# Patient Record
Sex: Female | Born: 2000 | Race: White | Hispanic: No | Marital: Single | Smoking: Never smoker
Health system: Southern US, Community
[De-identification: ages and names within clinical notes are randomized; demographics above are authoritative.]

## PROBLEM LIST (undated history)

## (undated) DIAGNOSIS — J45909 Unspecified asthma, uncomplicated: Secondary | ICD-10-CM

---
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
## 2020-01-08 ENCOUNTER — Other Ambulatory Visit: Payer: Self-pay

## 2020-01-08 ENCOUNTER — Encounter: Payer: Self-pay | Admitting: Emergency Medicine

## 2020-01-08 ENCOUNTER — Emergency Department (INDEPENDENT_AMBULATORY_CARE_PROVIDER_SITE_OTHER)
Admission: EM | Admit: 2020-01-08 | Discharge: 2020-01-08 | Disposition: A | Discharge status: Home / Self Care | Payer: BC Managed Care – PPO | Source: Home / Self Care | Attending: Family Medicine | Admitting: Family Medicine

## 2020-01-08 DIAGNOSIS — L600 Ingrowing nail: Secondary | ICD-10-CM | POA: Diagnosis not present

## 2020-01-08 MED ORDER — DOXYCYCLINE HYCLATE 100 MG PO CAPS: 100.0000 mg | ORAL_CAPSULE | Freq: Two times a day (BID) | ORAL | 0 refills | Status: DC

## 2020-01-08 NOTE — ED Triage Notes (Signed)
Rt great ingrown toe nail

## 2020-01-08 NOTE — Discharge Instructions (Addendum)
Change bandage twice daily and apply Bacitracin ointment to toe.  Keep toe clean and dry.  Return for any signs of infection (or follow-up with family doctor):  Increasing redness, swelling, pain, heat, drainage, etc.   Soak your foot in warm, soapy water. Do this for 20 minutes, 3 times a day. This helps to keep your toe clean and keep your skin soft.  Trim your toenails regularly and carefully. Cut your toenails straight across to prevent injury to the skin at the corners of the toenail. Do not cut your nails in a curved shape.

## 2020-01-08 NOTE — ED Provider Notes (Signed)
[name]    CSN: [id number] Arrival date & time: 01/08/20  1652      History   Chief Complaint Chief Complaint  Patient presents with  . Ingrown Toenail    HPI Ashley Vasquez is a 19 y.o. female.   Patient developed pain, swelling, and drainage from her left great toenail medial edge yesterday. She recently had a pedicure.  The history is provided by the patient.  Toe Pain This is a new problem. The current episode started yesterday. The problem occurs constantly. The problem has been gradually worsening. The symptoms are aggravated by walking and standing. Nothing relieves the symptoms. She has tried nothing for the symptoms.    Past Medical History:  Diagnosis Date  . Asthma     There are no problems to display for this patient.   History reviewed. No pertinent surgical history.  OB History   No obstetric history on file.      Home Medications    Prior to Admission medications   Medication Sig Start Date End Date Taking? Authorizing Provider  budesonide-formoterol (SYMBICORT) 160-4.5 MCG/ACT inhaler Inhale 1 puff into the lungs at bedtime.    [provider]  cetirizine-pseudoephedrine (ZYRTEC-D) 5-120 MG tablet Take 1 tablet by mouth daily. 05/21/17   Dorena Bodo, NP  doxycycline (VIBRAMYCIN) 100 MG capsule Take 1 capsule (100 mg total) by mouth 2 (two) times daily. Take with food. 01/08/20   Lattie Haw, MD  hydrOXYzine (ATARAX/VISTARIL) 25 MG tablet Take 25 mg by mouth at bedtime.    [provider]  montelukast (SINGULAIR) 10 MG tablet Take 10 mg by mouth at bedtime.    [provider]    Family History Family History  Problem Relation Age of Onset  . Healthy Mother   . Hypertension Father     Social History Social History   Tobacco Use  . Smoking status: Never Smoker  . Smokeless tobacco: Never Used  Substance Use Topics  . Alcohol use: Yes  . Drug use: Not on file     Allergies   Patient  has no known allergies.   Review of Systems Review of Systems  All other systems reviewed and are negative.    Physical Exam Triage Vital Signs ED Triage Vitals  Enc Vitals Group     BP 01/08/20 1715 132/85     Pulse Rate 01/08/20 1715 (!) 58     Resp --      Temp 01/08/20 1715 98.2 F (36.8 C)     Temp Source 01/08/20 1715 Oral     SpO2 01/08/20 1715 100 %     Weight 01/08/20 1716 130 lb (59 kg)     Height 01/08/20 1716 5\' 5"  (1.651 m)     Head Circumference --      Peak Flow --      Pain Score 01/08/20 1715 9     Pain Loc --      Pain Edu? --      Excl. in GC? --    No data found.  Updated Vital Signs BP 132/85 (BP Location: Right Arm)   Pulse (!) 58   Temp 98.2 F (36.8 C) (Oral)   Ht 5\' 5"  (1.651 m)   Wt 59 kg   LMP 01/06/2020 (Exact Date)   SpO2 100%   BMI 21.63 kg/m   Visual Acuity Right Eye Distance:   Left Eye Distance:   Bilateral Distance:    Right Eye Near:  Left Eye Near:    Bilateral Near:     Physical Exam Vitals reviewed.  Constitutional:      General: She is not in acute distress. Eyes:     Pupils: Pupils are equal, round, and reactive to light.  Cardiovascular:     Rate and Rhythm: Normal rate.  Pulmonary:     Effort: Pulmonary effort is normal.  Musculoskeletal:       Feet:     Comments: Medial edge of right great toenail is mildly swollen, erythematous and tender to palpation.  There is a small amount of purulent drainage present.  Skin:    General: Skin is warm and dry.  Neurological:     Mental Status: She is alert.      UC Treatments / Results  Labs (all labs ordered are listed, but only abnormal results are displayed) Labs Reviewed - No data to display  EKG   Radiology No results found.  Procedures Procedures (including critical care time)  Medications Ordered in UC Medications - No data to display  Initial Impression / Assessment and Plan / UC Course  I have reviewed the triage vital signs and the  nursing notes.  Pertinent labs & imaging results that were available during my care of the patient were reviewed by me and considered in my medical decision making (see chart for details).    Minimal ingrown nail, if at all.  Nail edge resection not indicated.  Mild cellulitis present. Begin doxycycline.  Culture taken.  Discussed proper nail care to minimize ingrown nails    Final Clinical Impressions(s) / UC Diagnoses   Final diagnoses:  Ingrown toenail of left foot     Discharge Instructions     Change bandage twice daily and apply Bacitracin ointment to toe.  Keep toe clean and dry.  Return for any signs of infection (or follow-up with family doctor):  Increasing redness, swelling, pain, heat, drainage, etc.   Soak your foot in warm, soapy water. Do this for 20 minutes, 3 times a day. This helps to keep your toe clean and keep your skin soft.  Trim your toenails regularly and carefully. Cut your toenails straight across to prevent injury to the skin at the corners of the toenail. Do not cut your nails in a curved shape.    ED Prescriptions    Medication Sig Dispense Auth. Provider   doxycycline (VIBRAMYCIN) 100 MG capsule Take 1 capsule (100 mg total) by mouth 2 (two) times daily. Take with food. 14 capsule Kandra Nicolas, MD        Kandra Nicolas, MD 01/08/20 [phone]

## 2020-01-13 LAB — WOUND CULTURE
MICRO NUMBER:: 10159953
SPECIMEN QUALITY:: ADEQUATE

## 2020-10-06 ENCOUNTER — Emergency Department (INDEPENDENT_AMBULATORY_CARE_PROVIDER_SITE_OTHER): Payer: BC Managed Care – PPO

## 2020-10-06 ENCOUNTER — Emergency Department
Admission: EM | Admit: 2020-10-06 | Discharge: 2020-10-06 | Disposition: A | Discharge status: Home / Self Care | Payer: BC Managed Care – PPO | Source: Home / Self Care | Attending: Family Medicine | Admitting: Family Medicine

## 2020-10-06 ENCOUNTER — Other Ambulatory Visit: Payer: Self-pay

## 2020-10-06 DIAGNOSIS — R0789 Other chest pain: Secondary | ICD-10-CM | POA: Diagnosis not present

## 2020-10-06 DIAGNOSIS — R0602 Shortness of breath: Secondary | ICD-10-CM | POA: Diagnosis not present

## 2020-10-06 DIAGNOSIS — M7918 Myalgia, other site: Secondary | ICD-10-CM

## 2020-10-06 NOTE — ED Provider Notes (Signed)
[name]    CSN: [id number] Arrival date & time: 10/06/20  1526      History   Chief Complaint Chief Complaint  Patient presents with  . Back Pain    HPI Ashley Vasquez is a 19 y.o. female.   Patient awakened two days ago with pain in her posterior upper chest, radiating around her chest.  The pain is worse with inspiration, although not pleuritic, and she denies shortness of breath.  She recalls no injury or change in activities.  She denies fevers, chills, and sweats and feels well otherwise.  She denies recent URI, and no pain or swelling in lower legs.  Ibuprofen 400mg  has not been very helpful.  The history is provided by the patient.    Past Medical History:  Diagnosis Date  . Asthma     There are no problems to display for this patient.   History reviewed. No pertinent surgical history.  OB History   No obstetric history on file.      Home Medications    Prior to Admission medications   Medication Sig Start Date End Date Taking? Authorizing Provider  drospirenone-ethinyl estradiol (YAZ) 3-0.02 MG tablet Take by mouth. 12/23/19  Yes [provider]  budesonide-formoterol (SYMBICORT) 160-4.5 MCG/ACT inhaler Inhale 1 puff into the lungs at bedtime.    [provider]  cetirizine-pseudoephedrine (ZYRTEC-D) 5-120 MG tablet Take 1 tablet by mouth daily. 05/21/17   07/22/17, NP  doxycycline (VIBRAMYCIN) 100 MG capsule Take 1 capsule (100 mg total) by mouth 2 (two) times daily. Take with food. 01/08/20   01/10/20, MD  hydrOXYzine (ATARAX/VISTARIL) 25 MG tablet Take 25 mg by mouth at bedtime.    [provider]  mometasone (ELOCON) 0.1 % cream Apply topically. 04/22/20   [provider]  montelukast (SINGULAIR) 10 MG tablet Take 10 mg by mouth at bedtime.    [provider]    Family History Family History  Problem Relation Age of Onset  . Healthy Mother   . Hypertension Father     Social  History Social History   Tobacco Use  . Smoking status: Never Smoker  . Smokeless tobacco: Never Used  Vaping Use  . Vaping Use: Never used  Substance Use Topics  . Alcohol use: Yes  . Drug use: Not on file     Allergies   Polymyxin b   Review of Systems Review of Systems  Constitutional: Negative for activity change, appetite change, chills, diaphoresis, fatigue and fever.  HENT: Negative.   Eyes: Negative.   Respiratory: Positive for chest tightness. Negative for cough, shortness of breath, wheezing and stridor.   Cardiovascular: Negative.   Genitourinary: Negative.   Musculoskeletal: Negative.   Skin: Negative for rash.  Neurological: Negative.   Hematological: Negative for adenopathy.     Physical Exam Triage Vital Signs ED Triage Vitals  Enc Vitals Group     BP 10/06/20 1542 114/77     Pulse Rate 10/06/20 1542 73     Resp 10/06/20 1542 17     Temp 10/06/20 1542 97.7 F (36.5 C)     Temp Source 10/06/20 1542 Oral     SpO2 10/06/20 1542 100 %     Weight --      Height --      Head Circumference --      Peak Flow --      Pain Score 10/06/20 1545 8     Pain Loc --  Pain Edu? --      Excl. in GC? --    No data found.  Updated Vital Signs BP 114/77 (BP Location: Left Arm)   Pulse 73   Temp 97.7 F (36.5 C) (Oral)   Resp 17   LMP 09/20/2020   SpO2 100%   Visual Acuity Right Eye Distance:   Left Eye Distance:   Bilateral Distance:    Right Eye Near:   Left Eye Near:    Bilateral Near:     Physical Exam Vitals and nursing note reviewed.  Constitutional:      General: She is not in acute distress. HENT:     Head: Normocephalic.     Right Ear: External ear normal.     Left Ear: External ear normal.     Nose: Nose normal.     Mouth/Throat:     Pharynx: Oropharynx is clear.  Eyes:     Conjunctiva/sclera: Conjunctivae normal.     Pupils: Pupils are equal, round, and reactive to light.  Cardiovascular:     Rate and Rhythm: Normal rate  and regular rhythm.     Heart sounds: Normal heart sounds.  Pulmonary:     Breath sounds: Normal breath sounds.       Comments: There is distinct tenderness over medial and inferior edges of both scapulae.  Pain elicited by resisted abduction of each shoulder while palpating rhomboid muscles. Tenderness is more pronounced on the left. Chest:     Chest wall: No tenderness.  Abdominal:     Palpations: Abdomen is soft.     Tenderness: There is no abdominal tenderness.  Musculoskeletal:        General: No tenderness.     Cervical back: Neck supple.     Right lower leg: No edema.     Left lower leg: No edema.  Lymphadenopathy:     Cervical: No cervical adenopathy.  Skin:    General: Skin is warm and dry.     Findings: No rash.  Neurological:     Mental Status: She is alert and oriented to person, place, and time.      UC Treatments / Results  Labs (all labs ordered are listed, but only abnormal results are displayed) Labs Reviewed  D-DIMER, QUANTITATIVE (NOT AT [facility])    EKG   Radiology DG Chest 2 View  Result Date: 10/06/2020 CLINICAL DATA:  19 year old female with back pain shortness of breath EXAM: CHEST - 2 VIEW COMPARISON:  None. FINDINGS: The heart size and mediastinal contours are within normal limits. Both lungs are clear. The visualized skeletal structures are unremarkable. IMPRESSION: No active cardiopulmonary disease. Electronically Signed   By: Gilmer Mor D.O.   On: 10/06/2020 16:25    Procedures Procedures (including critical care time)  Medications Ordered in UC Medications - No data to display  Initial Impression / Assessment and Plan / UC Course  I have reviewed the triage vital signs and the nursing notes.  Pertinent labs & imaging results that were available during my care of the patient were reviewed by me and considered in my medical decision making (see chart for details).    Normal chest x-ray reassuring.  Exam consistent with rhomboid  muscle inflammation.  PE unlikely but will check D-dimer as a precaution. Followup with Dr. [facility] if not improving about two weeks.   Final Clinical Impressions(s) / UC Diagnoses   Final diagnoses:  Posterior chest pain  Rhomboid muscle pain  Discharge Instructions     Apply ice pack to painful areas of back for 20 to 30 minutes, 3 to 4 times daily  Continue until pain decreases.  May take Ibuprofen 200mg , 4 tabs every 8 hours with food. Begin range of motion and stretching exercises as tolerated.  If symptoms become significantly worse during the night or over the weekend, proceed to the local emergency room.     ED Prescriptions    None        , MD 10/08/20 1103

## 2020-10-06 NOTE — Discharge Instructions (Addendum)
Apply ice pack to painful areas of back for 20 to 30 minutes, 3 to 4 times daily  Continue until pain decreases.  May take Ibuprofen 200mg , 4 tabs every 8 hours with food. Begin range of motion and stretching exercises as tolerated.  If symptoms become significantly worse during the night or over the weekend, proceed to the local emergency room.

## 2020-10-06 NOTE — ED Triage Notes (Signed)
Pt c/o mid back pain x 2 days. Says she just woke up like this. No known injury. Says pain wraps around rib cage, making it hard to breath. Hx of asthma. Ibuprofen with no relief.

## 2020-10-07 ENCOUNTER — Telehealth: Payer: Self-pay

## 2020-10-07 LAB — D-DIMER, QUANTITATIVE (NOT AT ARMC): D-Dimer, Quant: <0.19 mcg/mL FEU (ref <0.50)

## 2020-10-07 NOTE — Telephone Encounter (Signed)
D-dimer results discussed with patient (within normal limits). Questions asked and answers provided. Follow-up recommendations discussed and patient verbalized understanding.

## 2020-12-04 ENCOUNTER — Other Ambulatory Visit: Payer: Self-pay

## 2020-12-04 ENCOUNTER — Emergency Department (INDEPENDENT_AMBULATORY_CARE_PROVIDER_SITE_OTHER)
Admission: EM | Admit: 2020-12-04 | Discharge: 2020-12-04 | Disposition: A | Discharge status: Home / Self Care | Payer: BC Managed Care – PPO | Source: Home / Self Care | Attending: Emergency Medicine | Admitting: Emergency Medicine

## 2020-12-04 DIAGNOSIS — N3000 Acute cystitis without hematuria: Secondary | ICD-10-CM

## 2020-12-04 LAB — POCT URINALYSIS DIP (MANUAL ENTRY)
Bilirubin, UA: NEGATIVE
Blood, UA: NEGATIVE
Glucose, UA: NEGATIVE mg/dL
Ketones, POC UA: NEGATIVE mg/dL
Nitrite, UA: POSITIVE — AB
Protein Ur, POC: NEGATIVE mg/dL
Spec Grav, UA: 1.015 (ref 1.010–1.025)
Urobilinogen, UA: 0.2 E.U./dL
pH, UA: 8.5 — AB (ref 5.0–8.0)

## 2020-12-04 MED ORDER — PHENAZOPYRIDINE HCL 200 MG PO TABS: 200.0000 mg | ORAL_TABLET | Freq: Three times a day (TID) | ORAL | 0 refills | Status: AC | PRN

## 2020-12-04 MED ORDER — NITROFURANTOIN MONOHYD MACRO 100 MG PO CAPS: 100.0000 mg | ORAL_CAPSULE | Freq: Two times a day (BID) | ORAL | 0 refills | Status: AC

## 2020-12-04 NOTE — ED Triage Notes (Signed)
Patient presents to Urgent Care with complaints of burning with urination since three days ago. Patient reports she took extra strength azo and it has not gotten better, thinks she has a UTI.

## 2020-12-04 NOTE — ED Provider Notes (Signed)
HPI  SUBJECTIVE:  Ashley Vasquez is a 20 y.o. female who presents with 1 week of dysuria, urgency, frequency.  She reports low midline abdominal and back pain starting a few days ago.  No cloudy or odorous urine.  Not sure if there has been any hematuria.  No vaginal odor, rash, discharge, itching.  No nausea, vomiting, fevers, pelvic pain.  She is in a long-term monogamous relationship with a female who is asymptomatic.  STDs are not a concern today.  No antibiotics in the past month. No Antipyretic in the past 6 hours.  She has tried Azo without improvement in her symptoms.  No alleviating factors.  Symptoms are worse with urination.  She has a past medical history of frequent UTIs, nephrolithiasis, ovarian cysts.  She has a history of chlamydia, HSV, BV.  No history of pyelonephritis, gonorrhea, HIV, syphilis, trichomonas, yeast.  LMP: 2 days ago.  Denies the possibility being pregnant.  PMD: [facility] in [place]    Past Medical History:  Diagnosis Date  . Asthma     History reviewed. No pertinent surgical history.  Family History  Problem Relation Age of Onset  . Healthy Mother   . Hypertension Father     Social History   Tobacco Use  . Smoking status: Never Smoker  . Smokeless tobacco: Never Used  Vaping Use  . Vaping Use: Never used  Substance Use Topics  . Alcohol use: Yes    No current facility-administered medications for this encounter.  Current Outpatient Medications:  .  nitrofurantoin, macrocrystal-monohydrate, (MACROBID) 100 MG capsule, Take 1 capsule (100 mg total) by mouth 2 (two) times daily for 5 days., Disp: 10 capsule, Rfl: 0 .  phenazopyridine (PYRIDIUM) 200 MG tablet, Take 1 tablet (200 mg total) by mouth 3 (three) times daily as needed for pain., Disp: 6 tablet, Rfl: 0 .  budesonide-formoterol (SYMBICORT) 160-4.5 MCG/ACT inhaler, Inhale 1 puff into the lungs at bedtime., Disp: , Rfl:  .  cetirizine-pseudoephedrine (ZYRTEC-D) 5-120  MG tablet, Take 1 tablet by mouth daily., Disp: 30 tablet, Rfl: 1 .  drospirenone-ethinyl estradiol (YAZ) 3-0.02 MG tablet, Take by mouth., Disp: , Rfl:  .  hydrOXYzine (ATARAX/VISTARIL) 25 MG tablet, Take 25 mg by mouth at bedtime., Disp: , Rfl:  .  mometasone (ELOCON) 0.1 % cream, Apply topically., Disp: , Rfl:  .  montelukast (SINGULAIR) 10 MG tablet, Take 10 mg by mouth at bedtime., Disp: , Rfl:   Allergies  Allergen Reactions  . Polymyxin B Anaphylaxis     ROS  As noted in HPI.   Physical Exam  BP 113/73 (BP Location: Left Arm)   Pulse 78   Temp 98.6 F (37 C) (Oral)   Resp 16   SpO2 99%   Constitutional: Well developed, well nourished, no acute distress Eyes:  EOMI, conjunctiva normal bilaterally HENT: Normocephalic, atraumatic,mucus membranes moist Respiratory: Normal inspiratory effort Cardiovascular: Normal rate GI: nondistended. positive suprapubic, flank tenderness.  No lower quadrant tenderness. Back: No CVAT skin: No rash, skin intact Musculoskeletal: no deformities Neurologic: Alert & oriented x 3, no focal neuro deficits Psychiatric: Speech and behavior appropriate   ED Course   Medications - No data to display  Orders Placed This Encounter  Procedures  . Urine culture    Standing Status:   Standing    Number of Occurrences:   1    Order Specific Question:   List patient's active antibiotics    Answer:   macribuid  .  POCT urinalysis dipstick    Standing Status:   Standing    Number of Occurrences:   1    Results for orders placed or performed during the hospital encounter of 12/04/20 (from the past 24 hour(s))  POCT urinalysis dipstick     Status: Abnormal   Collection Time: 12/04/20  6:16 PM  Result Value Ref Range   Color, UA yellow yellow   Clarity, UA turbid (A) clear   Glucose, UA negative negative mg/dL   Bilirubin, UA negative negative   Ketones, POC UA negative negative mg/dL   Spec Grav, UA 9.509 3.267 - 1.025   Blood, UA  negative negative   pH, UA 8.5 (A) 5.0 - 8.0   Protein Ur, POC negative negative mg/dL   Urobilinogen, UA 0.2 0.2 or 1.0 E.U./dL   Nitrite, UA Positive (A) Negative   Leukocytes, UA Trace (A) Negative   No results found.  ED Clinical Impression  1. Acute cystitis without hematuria      ED Assessment/Plan  UA consistent with UTI with trace leukocytes, positive nitrite.  We talked about doing STD testing, but she is not concerned for this today and I feel that it is reasonable to try treating this as if it is a UTI initially.  She can return to clinic if not getting any better, and we can reconsider other causes of her symptoms such as GYN infections at that time.  We will send urine for culture to confirm diagnosis and antibiotic choice.  Home with Macrobid, Pyridium.  Return precautions given  Discussed labs,MDM, treatment plan, and plan for follow-up with patient. Discussed sn/sx that should prompt return to the ED. patient agrees with plan.   Meds ordered this encounter  Medications  . phenazopyridine (PYRIDIUM) 200 MG tablet    Sig: Take 1 tablet (200 mg total) by mouth 3 (three) times daily as needed for pain.    Dispense:  6 tablet    Refill:  0  . nitrofurantoin, macrocrystal-monohydrate, (MACROBID) 100 MG capsule    Sig: Take 1 capsule (100 mg total) by mouth 2 (two) times daily for 5 days.    Dispense:  10 capsule    Refill:  0    *This clinic note was created using Scientist, clinical (histocompatibility and immunogenetics). Therefore, there may be occasional mistakes despite careful proofreading.   ?    Domenick Gong, MD 12/05/20 1301

## 2020-12-04 NOTE — Discharge Instructions (Addendum)
Push plenty of fluids.  Finish the [employer], even if you feel better.  Pyridium will help with your symptoms.  It will turn your urine orange.

## 2022-05-27 IMAGING — DX DG CHEST 2V
2 series · 2 of 2 positions shown · non-contrast
Comparison: None.

CLINICAL DATA: 19-year-old female with back pain shortness of
breath

EXAM:
CHEST - 2 VIEW

[chest pa]
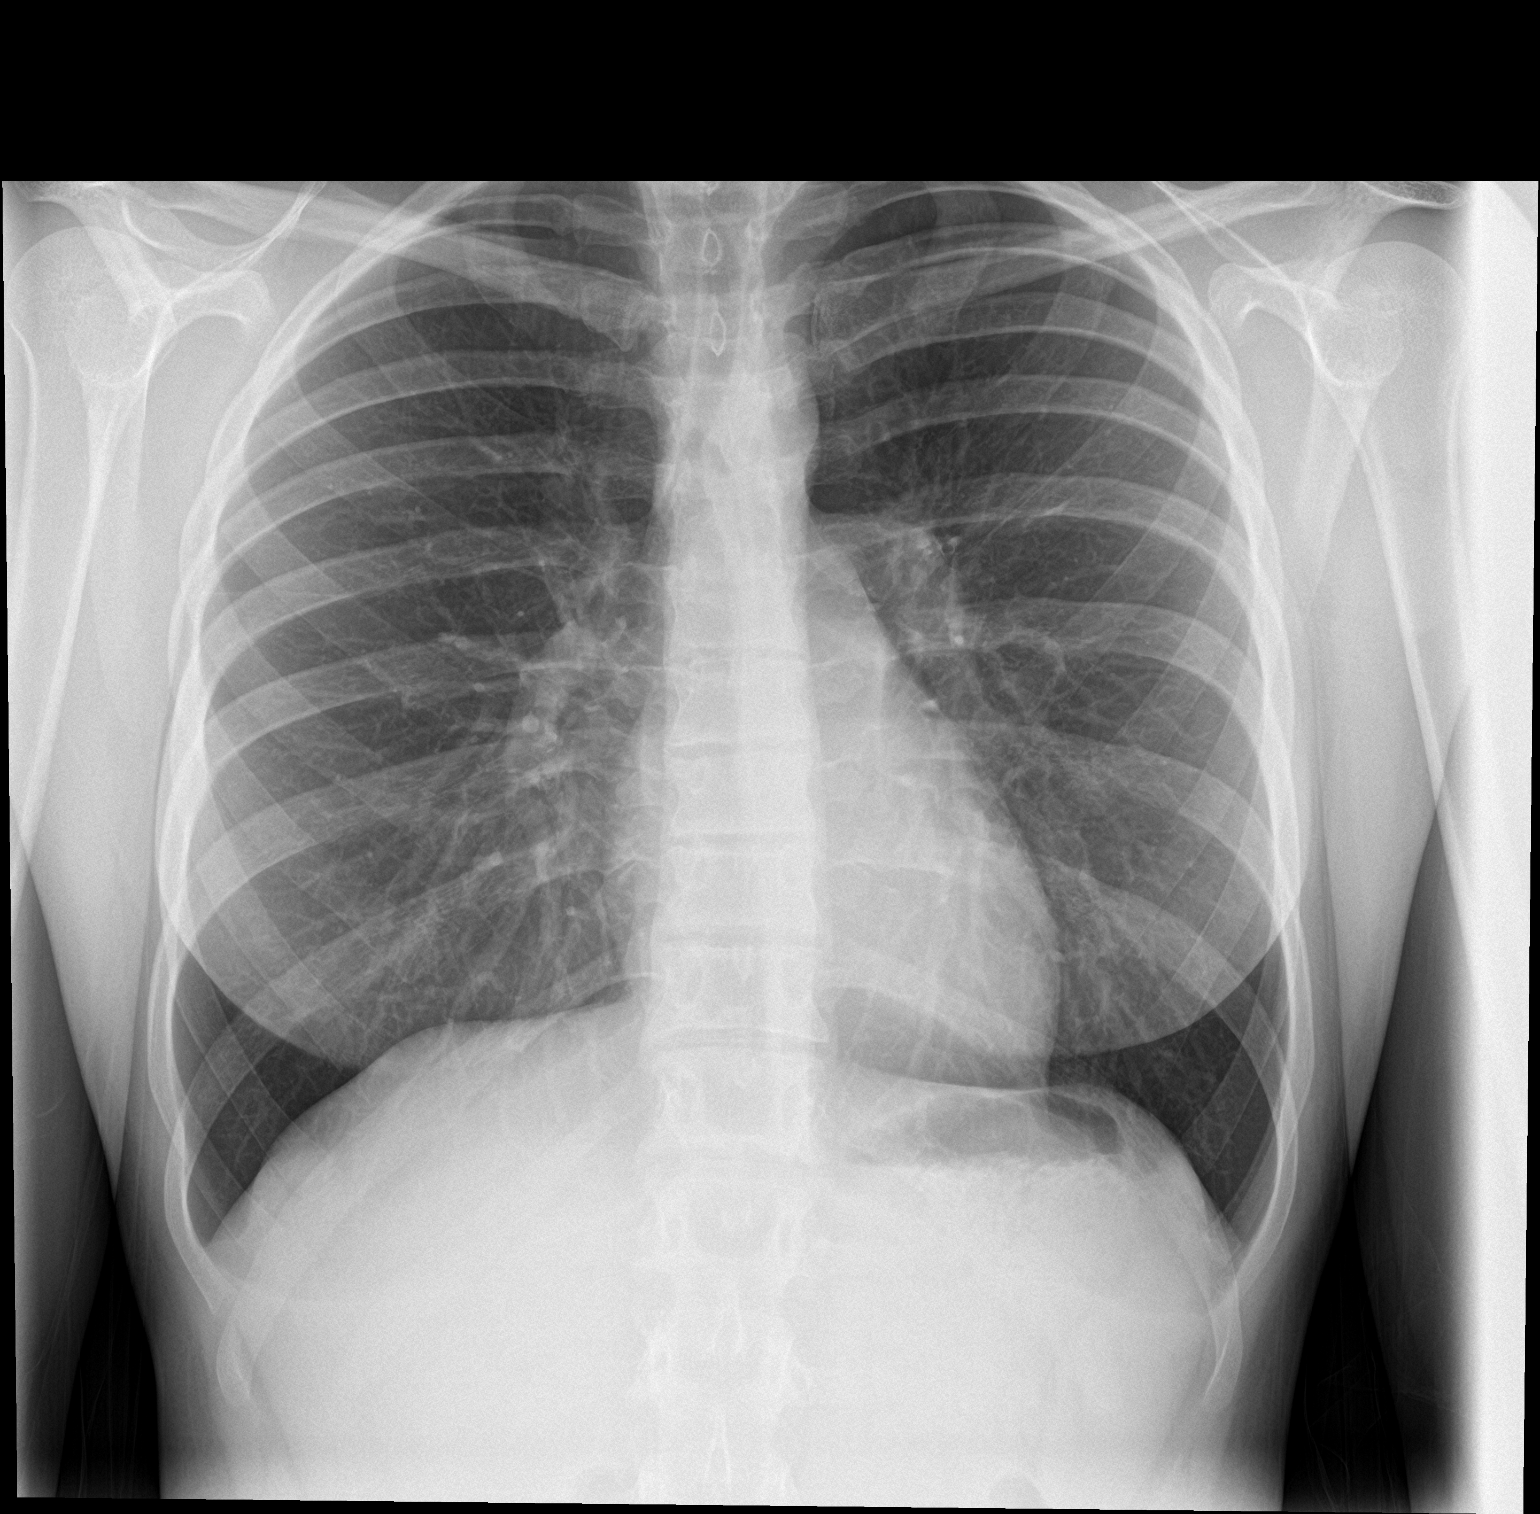

[chest lat]
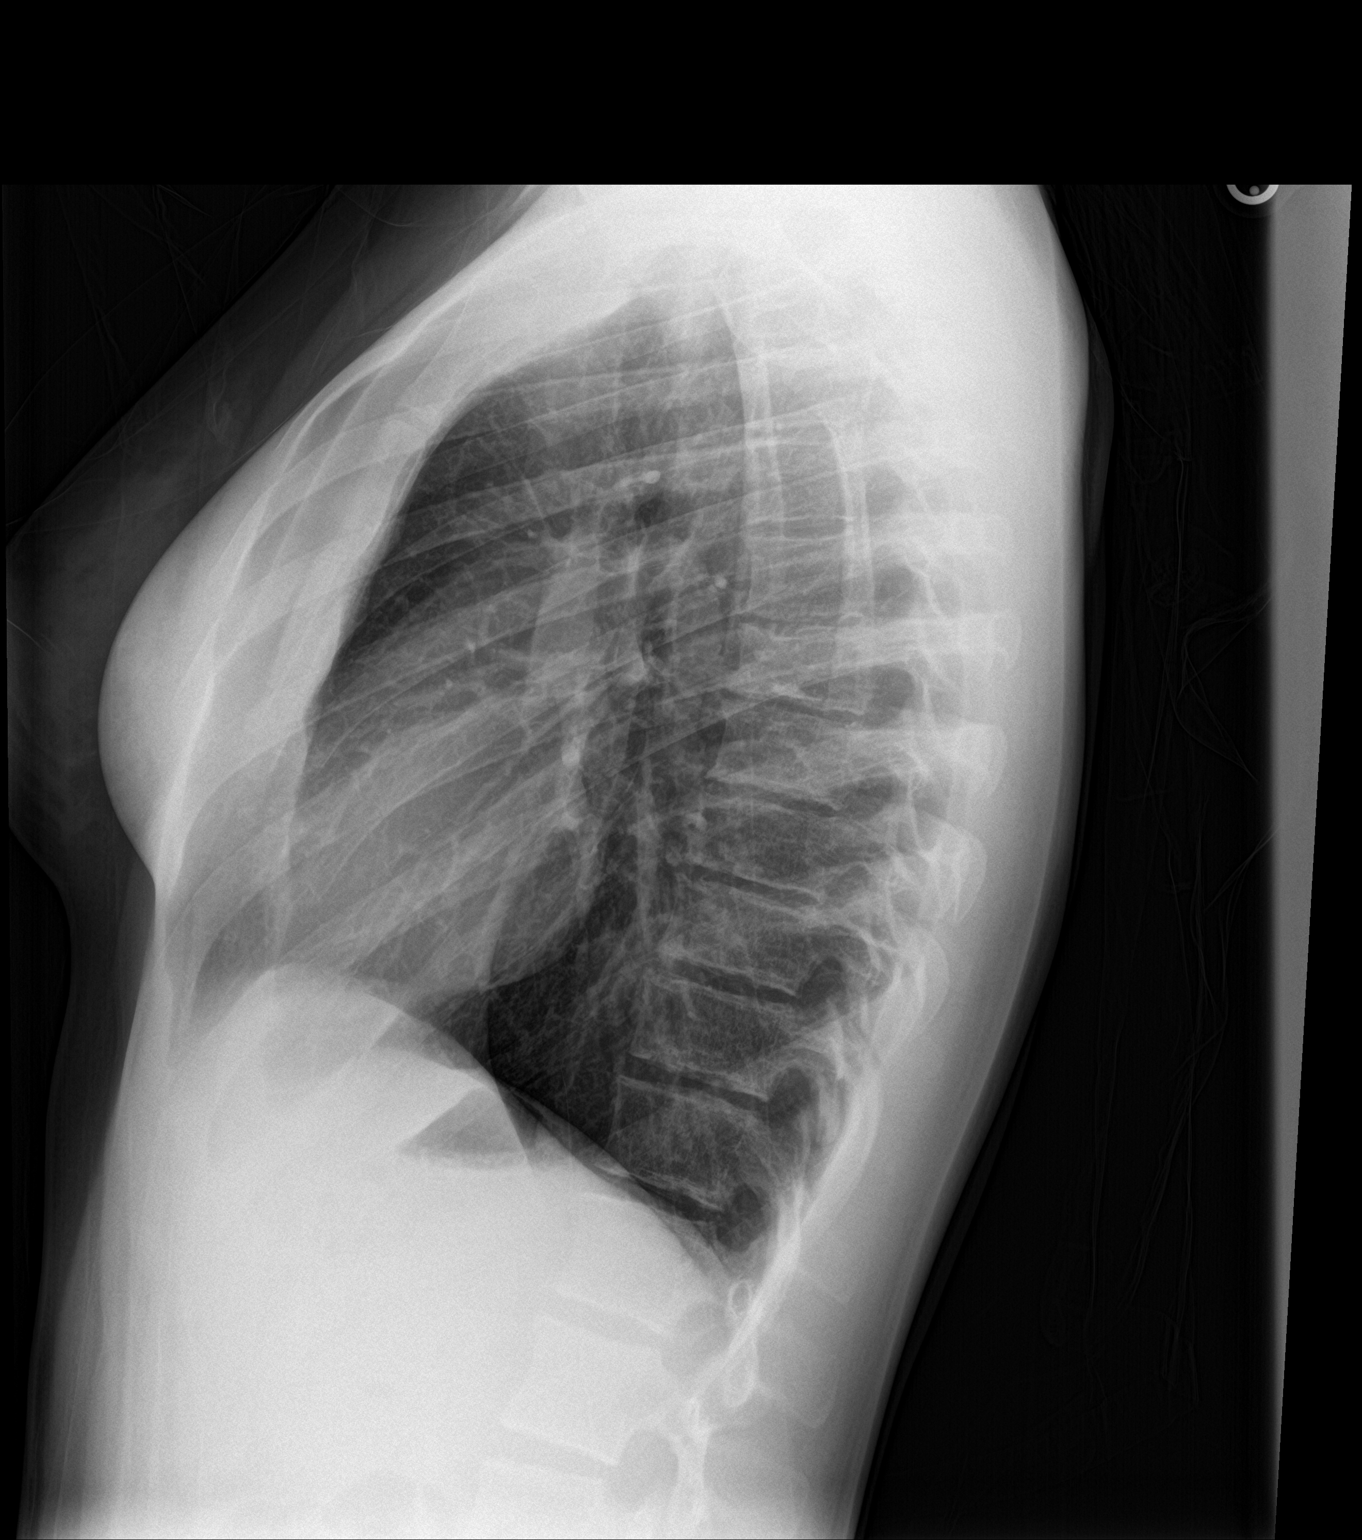

[2 of 2 positions shown; findings below may reference images not displayed]

FINDINGS: The heart size and mediastinal contours are within normal limits.
Both lungs are clear. The visualized skeletal structures are
unremarkable.
IMPRESSION: No active cardiopulmonary disease.
